# Patient Record
Sex: Male | Born: 1995 | Hispanic: No | Marital: Single | Smoking: Never smoker
Health system: Southern US, Community
[De-identification: ages and names within clinical notes are randomized; demographics above are authoritative.]

## PROBLEM LIST (undated history)

## (undated) DIAGNOSIS — L509 Urticaria, unspecified: Secondary | ICD-10-CM

## (undated) HISTORY — DX: Urticaria, unspecified: L50.9

---
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
## 2016-02-14 ENCOUNTER — Ambulatory Visit (INDEPENDENT_AMBULATORY_CARE_PROVIDER_SITE_OTHER): Payer: BLUE CROSS/BLUE SHIELD | Admitting: Physician Assistant

## 2016-02-14 VITALS — BP 110/70 | HR 85 | Temp 98.2°F | Resp 16 | Ht 68.0 in | Wt 139.0 lb

## 2016-02-14 DIAGNOSIS — R059 Cough, unspecified: Secondary | ICD-10-CM

## 2016-02-14 DIAGNOSIS — Z23 Encounter for immunization: Secondary | ICD-10-CM | POA: Diagnosis not present

## 2016-02-14 DIAGNOSIS — R0989 Other specified symptoms and signs involving the circulatory and respiratory systems: Secondary | ICD-10-CM | POA: Diagnosis not present

## 2016-02-14 DIAGNOSIS — R05 Cough: Secondary | ICD-10-CM

## 2016-02-14 MED ORDER — PREDNISONE 20 MG PO TABS: 40.0000 mg | ORAL_TABLET | Freq: Every day | ORAL | 0 refills | Status: DC

## 2016-02-14 MED ORDER — IPRATROPIUM BROMIDE 0.02 % IN SOLN
0.5000 mg | RESPIRATORY_TRACT | Status: AC
  Administered 2016-02-14: 0.5 mg via RESPIRATORY_TRACT

## 2016-02-14 MED ORDER — ALBUTEROL SULFATE (2.5 MG/3ML) 0.083% IN NEBU
2.5000 mg | INHALATION_SOLUTION | RESPIRATORY_TRACT | Status: AC
  Administered 2016-02-14: 2.5 mg via RESPIRATORY_TRACT

## 2016-02-14 MED ORDER — ALBUTEROL SULFATE HFA 108 (90 BASE) MCG/ACT IN AERS: 2.0000 | INHALATION_SPRAY | Freq: Four times a day (QID) | RESPIRATORY_TRACT | 0 refills | Status: DC | PRN

## 2016-02-14 NOTE — Patient Instructions (Signed)
     IF you received an x-ray today, you will receive an invoice from [facility]. Please contact [facility] at [phone] with questions or concerns regarding your invoice.   IF you received labwork today, you will receive an invoice from [facility]. Please contact [facility] at [phone] with questions or concerns regarding your invoice.   Our billing staff will not be able to assist you with questions regarding bills from these companies.  You will be contacted with the lab results as soon as they are available. The fastest way to get your results is to activate your My Chart account. Instructions are located on the last page of this paperwork. If you have not heard from us regarding the results in 2 weeks, please contact this office.      

## 2016-02-14 NOTE — Progress Notes (Signed)
°  By signing my name below, I, Raven Small, attest that this documentation has been prepared under the direction and in the presence of Deliah BostonMichael Clark, PA-C.  Electronically Signed: Andrew Auaven Small, ED Scribe. 02/14/2016. 2:13 PM.  02/14/2016 2:13 PM   DOB: 21-Feb-1996 / MRN: [id number]  SUBJECTIVE:  Hector Sanders is a 20 y.o. male presenting for a dry cough onset 2 weeks ago. He has a hx of asthma. Pt is not taking any medications.  States symptoms started 2 weeks ago with cold that lasted about 7 days.  States cold improved but cough persisted. Cough worsens throughout the day. He reports some throat irritation described as tickling. He denies CP, chest tightness, leg swelling, heart burn, SOB and fever.  No hx of diabetes.   He has No Known Allergies.   He  has no past medical history on file.    He  reports that he has never smoked. He has never used smokeless tobacco. He reports that he does not drink alcohol or use drugs. He  has no sexual activity history on file. The patient  has no past surgical history on file.  His family history is not on file.  Review of Systems  Constitutional: Negative for chills and fever.  Respiratory: Positive for cough. Negative for sputum production, shortness of breath and wheezing.   Cardiovascular: Negative for chest pain and leg swelling.  Gastrointestinal: Negative for heartburn.    The problem list and medications were reviewed and updated by myself where necessary and exist elsewhere in the encounter.   OBJECTIVE:  BP 110/70 (BP Location: Right Arm, Patient Position: Sitting, Cuff Size: Normal)    Pulse 85    Temp 98.2 F (36.8 C) (Oral)    Resp 16    Ht 5\' 8"  (1.727 m)    Wt 139 lb (63 kg)    SpO2 99%    BMI 21.13 kg/m   Physical Exam  Constitutional: He is oriented to person, place, and time. Vital signs are normal. He appears well-developed.  Cardiovascular: Normal rate.   Pulmonary/Chest: Effort normal. No respiratory distress. He has no  wheezes. He has no rhonchi. He has no rales.  Musculoskeletal: Normal range of motion.  Neurological: He is alert and oriented to person, place, and time.  Skin: Skin is warm and dry.  Psychiatric: He has a normal mood and affect. His behavior is normal. Judgment and thought content normal.   Spirometry: 400 Post duoneb: 500  ASSESSMENT AND PLAN  Cough Asthma like presentation despite negative wheeze. Peak flow improved by 1/4 after duoneb. Sending prednisone and inhaler into phramacy. Advise he monitor night time cough and try albuterol if he coughs. RTC if cough is persistent after getting him through this flare as he may benefit for potential long term treatments for asthma.   Abnormally low peak expiratory flow rate - Plan: albuterol (PROVENTIL) (2.5 MG/3ML) 0.083% nebulizer solution 2.5 mg, ipratropium (ATROVENT) nebulizer solution 0.5 mg  Needs flu shot - Plan: Flu Vaccine QUAD 36+ mos IM     The patient is advised to call or return to clinic if he does not see an improvement in symptoms, or to seek the care of the closest emergency department if he worsens with the above plan.   Deliah BostonMichael Clark, MHS, PA-C [facility] 02/14/2016 2:13 PM

## 2016-03-07 ENCOUNTER — Ambulatory Visit (INDEPENDENT_AMBULATORY_CARE_PROVIDER_SITE_OTHER): Payer: BLUE CROSS/BLUE SHIELD

## 2016-03-07 ENCOUNTER — Ambulatory Visit (INDEPENDENT_AMBULATORY_CARE_PROVIDER_SITE_OTHER): Payer: BLUE CROSS/BLUE SHIELD | Admitting: Physician Assistant

## 2016-03-07 VITALS — BP 104/74 | HR 88 | Temp 97.8°F | Resp 16 | Ht 68.0 in | Wt 138.8 lb

## 2016-03-07 DIAGNOSIS — Z23 Encounter for immunization: Secondary | ICD-10-CM | POA: Diagnosis not present

## 2016-03-07 DIAGNOSIS — R053 Chronic cough: Secondary | ICD-10-CM

## 2016-03-07 DIAGNOSIS — R05 Cough: Secondary | ICD-10-CM

## 2016-03-07 MED ORDER — PREDNISONE 20 MG PO TABS: ORAL_TABLET | ORAL | 0 refills | Status: AC

## 2016-03-07 MED ORDER — RANITIDINE HCL 150 MG PO TABS: 150.0000 mg | ORAL_TABLET | Freq: Two times a day (BID) | ORAL | 1 refills | Status: DC

## 2016-03-07 NOTE — Patient Instructions (Signed)
     IF you received an x-ray today, you will receive an invoice from [facility]. Please contact [facility] at [phone] with questions or concerns regarding your invoice.   IF you received labwork today, you will receive an invoice from [facility]. Please contact [facility] at [phone] with questions or concerns regarding your invoice.   Our billing staff will not be able to assist you with questions regarding bills from these companies.  You will be contacted with the lab results as soon as they are available. The fastest way to get your results is to activate your My Chart account. Instructions are located on the last page of this paperwork. If you have not heard from us regarding the results in 2 weeks, please contact this office.      

## 2016-03-07 NOTE — Progress Notes (Signed)
03/07/2016 2:10 PM   DOB: 1995-10-02 / MRN: [id number]  SUBJECTIVE:  Hector Sanders is a 20 y.o. male presenting for recheck of cough.  Reports the albuterol inhaler and steroid did help his cough, however upon pred cessation his cough slowly returned.  The cough was preceded by a cold that started about 1 month ago.  He has no history of asthma. Denies GERD, voice change, acidic taste.     Immunization History  Administered Date(s) Administered  . Influenza,inj,Quad PF,36+ Mos 02/14/2016  . Tdap 03/07/2016   He has No Known Allergies.   He  has no past medical history on file.    He  reports that he has never smoked. He has never used smokeless tobacco. He reports that he does not drink alcohol or use drugs. He  has no sexual activity history on file. The patient  has no past surgical history on file.  His family history is not on file.  Review of Systems  Constitutional: Negative for chills and fever.  Respiratory: Negative for hemoptysis, sputum production, shortness of breath and wheezing.   Cardiovascular: Negative for chest pain.  Gastrointestinal: Negative for nausea.  Skin: Negative for itching and rash.  Neurological: Negative for dizziness.    The problem list and medications were reviewed and updated by myself where necessary and exist elsewhere in the encounter.   OBJECTIVE:  BP 104/74 (BP Location: Right Arm, Patient Position: Sitting, Cuff Size: Normal)   Pulse 88   Temp 97.8 F (36.6 C) (Oral)   Resp 16   Ht 5\' 8"  (1.727 m)   Wt 138 lb 12.8 oz (63 kg)   SpO2 99%   BMI 21.10 kg/m   Physical Exam  Constitutional: He is oriented to person, place, and time. No distress.  Cardiovascular: Normal rate, regular rhythm, normal heart sounds and intact distal pulses.   Pulmonary/Chest: Effort normal and breath sounds normal.  Musculoskeletal: Normal range of motion. He exhibits no edema.  Neurological: He is alert and oriented to person, place, and time.  Skin: He  is not diaphoretic.    No results found for this or any previous visit (from the past 72 hour(s)).  Dg Chest 2 View  Result Date: 03/07/2016 CLINICAL DATA:  Persistent cough, postviral, for roughly 1 month. EXAM: CHEST  2 VIEW COMPARISON:  None. FINDINGS: The heart size and mediastinal contours are within normal limits. Both lungs are clear. The visualized skeletal structures are unremarkable. IMPRESSION: No active cardiopulmonary disease. Electronically Signed   By: Elsie StainJohn T Curnes M.D.   On: 03/07/2016 14:01    ASSESSMENT AND PLAN  Hector Sanders was seen today for follow-up.  Diagnoses and all orders for this visit:  Persistent cough for 3 weeks or longer Comments: Reactive vs. post viral vs. upper airway.  Will cover for first 2 with more pred.. Zantac nightly for latter. RTC 2 weeks for ICS if needed. Orders: -     Tdap vaccine greater than or equal to 7yo IM -     DG Chest 2 View; Future -     predniSONE (DELTASONE) 20 MG tablet; Take 3 in the morning for 3 days, then 2 in the morning for 3 days, and then 1 in the morning for 3 days. -     ranitidine (ZANTAC) 150 MG tablet; Take 1 tablet (150 mg total) by mouth 2 (two) times daily.    The patient is advised to call or return to clinic if he does not see an  improvement in symptoms, or to seek the care of the closest emergency department if he worsens with the above plan.   Deliah BostonMichael Clark, MHS, PA-C [facility] 03/07/2016 2:10 PM

## 2018-05-22 IMAGING — DX DG CHEST 2V
2 series · 2 of 2 positions shown · non-contrast
Comparison: None.

CLINICAL DATA: Persistent cough, postviral, for roughly 1 month.

EXAM:
CHEST  2 VIEW

[chest pa]
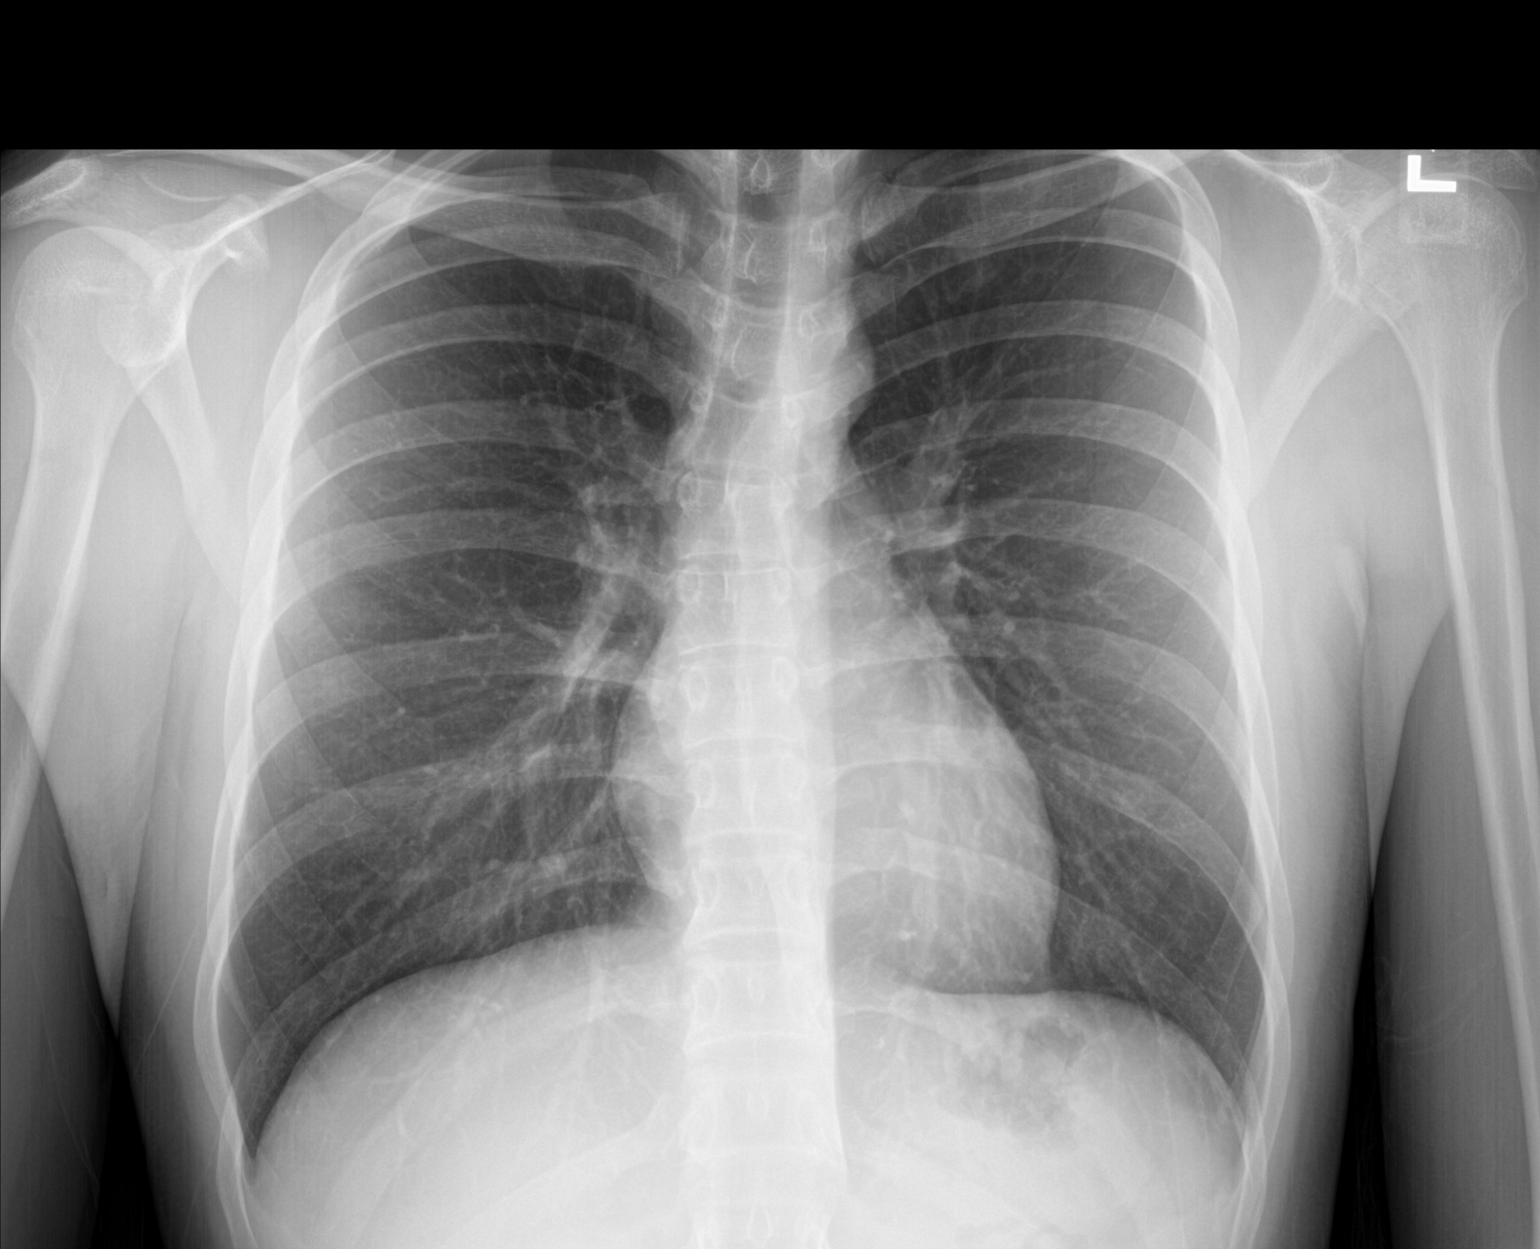

[chest lat]
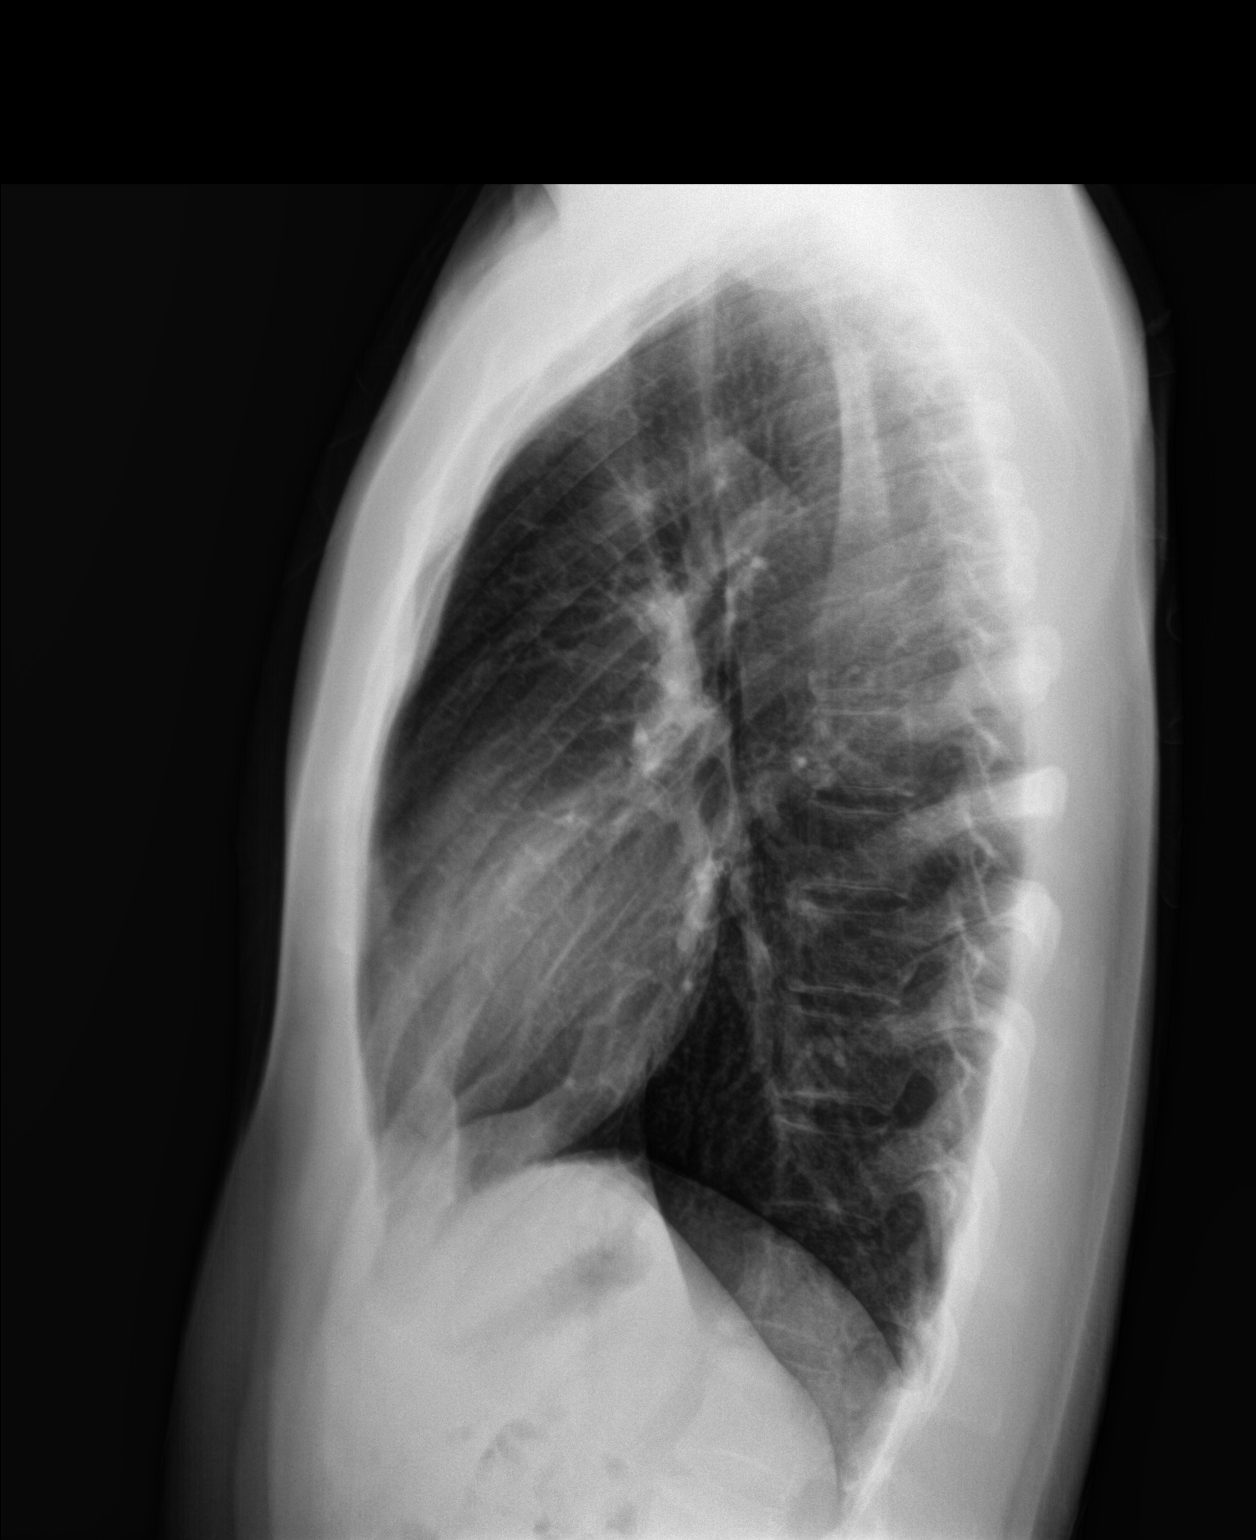

[2 of 2 positions shown; findings below may reference images not displayed]

FINDINGS: The heart size and mediastinal contours are within normal limits.
Both lungs are clear. The visualized skeletal structures are
unremarkable.
IMPRESSION: No active cardiopulmonary disease.

## 2020-11-26 ENCOUNTER — Other Ambulatory Visit: Payer: Self-pay

## 2020-11-26 ENCOUNTER — Ambulatory Visit (INDEPENDENT_AMBULATORY_CARE_PROVIDER_SITE_OTHER): Payer: 59 | Admitting: Allergy

## 2020-11-26 ENCOUNTER — Encounter: Payer: Self-pay | Admitting: Allergy

## 2020-11-26 VITALS — BP 110/70 | HR 116 | Temp 98.8°F | Resp 16 | Ht 67.0 in | Wt 174.4 lb

## 2020-11-26 DIAGNOSIS — L508 Other urticaria: Secondary | ICD-10-CM | POA: Diagnosis not present

## 2020-11-26 NOTE — Progress Notes (Signed)
New Patient Note  RE: Hector Sanders MRN: [id number] DOB: 14-Feb-1996 Date of Office Visit: 11/26/2020  Referring provider: No ref. provider found Primary care provider: Pcp, No  Chief Complaint: hives  History of present illness: Hector Sanders is a 25 y.o. male presenting today for evaluation of urticaria.    Hive started in late May 2022 and occurring daily.  Hives can occur anywhere on body and is very itchy.  He states hives are present for about a week before resolving.  Hives are leaving bruising behind.  No fevers.  He does report some pain of his left knee.  He does report swelling of his fingers.  No preceding illness.  No new medications.  No new foods or food flares.  He states he has tried some food elimination which did not help.  No stings/bites.  No change in soaps/detergerent/body products.  He has gone twice to UC for this issue and was treated with prednisone both times.  The prednisone "really helped".  He has been taking zyrtec and zantac (famotidine) both once a day and feels it is helping.    He reports history of asthma during middle school that resolved. No history of eczema. He does report coughing during spring season.  No history of food allergy.    Review of systems: Review of Systems  Constitutional: Negative.   HENT: Negative.    Eyes: Negative.   Respiratory: Negative.    Cardiovascular: Negative.   Gastrointestinal: Negative.   Musculoskeletal:  Positive for joint pain.  Skin:  Positive for itching and rash.  Neurological: Negative.    All other systems negative unless noted above in HPI  Past medical history: Past Medical History:  Diagnosis Date   Urticaria     Past surgical history: History reviewed. No pertinent surgical history.  Family history:  Family History  Problem Relation Age of Onset   Urticaria Sister    Allergic rhinitis Sister     Social history: Lives in a home with carpeting with gas heating and central cooling.  No  concern for water damage, mildew or roaches in the home.  No pets in the home.  He does not report an occupation at this time.  Does not report a smoking history.   Medication List: Current Outpatient Medications  Medication Sig Dispense Refill   cetirizine (ZYRTEC) 10 MG tablet Take 10 mg by mouth daily.     ranitidine (ZANTAC) 150 MG tablet Take 1 tablet (150 mg total) by mouth 2 (two) times daily. 30 tablet 1   No current facility-administered medications for this visit.    Known medication allergies: No Known Allergies   Physical examination: Blood pressure 110/70, pulse (!) 116, temperature 98.8 F (37.1 C), temperature source Temporal, resp. rate 16, height 5\' 7"  (1.702 m), weight 174 lb 6.4 oz (79.1 kg), SpO2 95 %.  General: Alert, interactive, in no acute distress. HEENT: PERRLA, TMs pearly gray, turbinates non-edematous without discharge, post-pharynx non erythematous. Neck: Supple without lymphadenopathy. Lungs: Clear to auscultation without wheezing, rhonchi or rales. {no increased work of breathing. CV: Normal S1, S2 without murmurs. Abdomen: Nondistended, nontender. Skin: Scattered erythematous urticarial type lesions primarily located lower extremities bilaterally extending to the medial thigh, left to medial upper arm, left side neck, nonvesicular.  There are some hyperpigmented patches on the left medial forearm Extremities:  No clubbing, cyanosis or edema. Neuro:   Grossly intact.  Diagnositics/Labs: None due to ongoing urticaria  Assessment and plan: Chronic urticaria -  at this time etiology of hives is unknown.  Hives can be caused by a variety of different triggers including illness/infection, foods, medications, stings, exercise, pressure, vibrations, extremes of temperature to name a few however majority of the time there is no identifiable trigger.  There are some concerning features to his urticaria including bruising and joint ache.  He may warrant biopsy  to rule out urticarial vasculitis.  Your symptoms have been ongoing for >6 weeks making this chronic thus will obtain labwork to evaluate: CBC w diff, CMP, tryptase, hive panel, environmental panel, alpha-gal panel, inflammatory markers - for hive control recommend taking your Zyrtec and "Zantac " 1 tab twice a day -if twice a day dosing is not effective enough then would recommend adding Singulair -if above antihistamine regimen is not effective enough in controlling hives and swelling then recommend adding Singulair 10mg  daily.    If triple therapy regimen is not effective enough then consider Xolair monthly injections which is indicated for chronic spontaneous hives/swelling  Follow-up in 2-3 months or sooner if needed  I appreciate the opportunity to take part in Josiel's care. Please do not hesitate to contact me with questions.  Sincerely,   , MD Allergy/Immunology [facility]

## 2020-11-26 NOTE — Patient Instructions (Addendum)
Hives - at this time etiology of hives and swelling is unknown.  Hives can be caused by a variety of different triggers including illness/infection, foods, medications, stings, exercise, pressure, vibrations, extremes of temperature to name a few however majority of the time there is no identifiable trigger.  Your symptoms have been ongoing for >6 weeks making this chronic thus will obtain labwork to evaluate: CBC w diff, CMP, tryptase, hive panel, environmental panel, alpha-gal panel, inflammatory markers - for hive control recommend taking your Zyrtec and "Zantac " 1 tab twice a day -if twice a day dosing is not effective enough then would recommend adding Singulair -if above antihistamine regimen is not effective enough in controlling hives and swelling then recommend adding Singulair 10mg  daily.    If triple therapy regimen is not effective enough then consider Xolair monthly injections which is indicated for chronic spontaneous hives/swelling  Follow-up in 2-3 months or sooner if needed

## 2020-12-07 ENCOUNTER — Telehealth: Payer: Self-pay | Admitting: Allergy

## 2020-12-07 NOTE — Telephone Encounter (Signed)
Please advise to regiment for pts hive break out as the zyrtec and zantac is not helping

## 2020-12-07 NOTE — Telephone Encounter (Signed)
Patient called because he does not feel his current allergy regiment is working for him. Patient states he is currently using zyrtec and zantac and at first it was working for him, however the past few days he has noticed more red areas and hives. Patient was told to call if regiment was not working so that singulair could be called in for him.   [facility] - [address] [place], [place], [place] [address] [phone]  Patient is also requesting Dr. Delorse Lek take over his zyrtec and zantac prescriptions.   Patient would like a call when medication gets sent in, [phone]

## 2020-12-08 ENCOUNTER — Telehealth: Payer: Self-pay

## 2020-12-08 LAB — ALPHA-GAL PANEL
Allergen Lamb IgE: <0.1 kU/L
Beef IgE: <0.1 kU/L
IgE (Immunoglobulin E), Serum: 123 IU/mL (ref 6–495)
O215-IgE Alpha-Gal: <0.1 kU/L
Pork IgE: <0.1 kU/L

## 2020-12-08 LAB — ALLERGENS W/TOTAL IGE AREA 2
Alternaria Alternata IgE: <0.1 kU/L
Aspergillus Fumigatus IgE: <0.1 kU/L
Bermuda Grass IgE: <0.1 kU/L
Cat Dander IgE: <0.1 kU/L
Cedar, Mountain IgE: <0.1 kU/L
Cladosporium Herbarum IgE: <0.1 kU/L
Cockroach, German IgE: 0.3 kU/L — AB
Common Silver Birch IgE: <0.1 kU/L
Cottonwood IgE: <0.1 kU/L
D Farinae IgE: 1.57 kU/L — AB
D Pteronyssinus IgE: 1.67 kU/L — AB
Dog Dander IgE: <0.1 kU/L
Elm, American IgE: <0.1 kU/L
Johnson Grass IgE: <0.1 kU/L
Maple/Box Elder IgE: <0.1 kU/L
Mouse Urine IgE: <0.1 kU/L
Oak, White IgE: <0.1 kU/L
Pecan, Hickory IgE: <0.1 kU/L
Penicillium Chrysogen IgE: <0.1 kU/L
Pigweed, Rough IgE: <0.1 kU/L
Ragweed, Short IgE: 0.11 kU/L — AB
Sheep Sorrel IgE Qn: <0.1 kU/L
Timothy Grass IgE: <0.1 kU/L
White Mulberry IgE: <0.1 kU/L

## 2020-12-08 LAB — CBC WITH DIFFERENTIAL
Basophils Absolute: 0 10*3/uL (ref 0.0–0.2)
Basos: 0 %
EOS (ABSOLUTE): 0.1 10*3/uL (ref 0.0–0.4)
Eos: 1 %
Hematocrit: 52.6 % — ABNORMAL HIGH (ref 37.5–51.0)
Hemoglobin: 17 g/dL (ref 13.0–17.7)
Immature Grans (Abs): 0.1 10*3/uL (ref 0.0–0.1)
Immature Granulocytes: 1 %
Lymphocytes Absolute: 2 10*3/uL (ref 0.7–3.1)
Lymphs: 16 %
MCH: 29 pg (ref 26.6–33.0)
MCHC: 32.3 g/dL (ref 31.5–35.7)
MCV: 90 fL (ref 79–97)
Monocytes Absolute: 0.8 10*3/uL (ref 0.1–0.9)
Monocytes: 7 %
Neutrophils Absolute: 9.1 10*3/uL — ABNORMAL HIGH (ref 1.4–7.0)
Neutrophils: 75 %
RBC: 5.87 x10E6/uL — ABNORMAL HIGH (ref 4.14–5.80)
RDW: 12.6 % (ref 11.6–15.4)
WBC: 12.1 10*3/uL — ABNORMAL HIGH (ref 3.4–10.8)

## 2020-12-08 LAB — ANA W/REFLEX: Anti Nuclear Antibody (ANA): NEGATIVE

## 2020-12-08 LAB — SEDIMENTATION RATE: Sed Rate: 12 mm/hr (ref 0–15)

## 2020-12-08 LAB — COMPREHENSIVE METABOLIC PANEL
ALT: 28 IU/L (ref 0–44)
AST: 23 IU/L (ref 0–40)
Albumin/Globulin Ratio: 1.8 (ref 1.2–2.2)
Albumin: 5.3 g/dL — ABNORMAL HIGH (ref 4.1–5.2)
Alkaline Phosphatase: 114 IU/L (ref 44–121)
BUN/Creatinine Ratio: 15 (ref 9–20)
BUN: 13 mg/dL (ref 6–20)
Bilirubin Total: 0.6 mg/dL (ref 0.0–1.2)
CO2: 25 mmol/L (ref 20–29)
Calcium: 10.3 mg/dL — ABNORMAL HIGH (ref 8.7–10.2)
Chloride: 99 mmol/L (ref 96–106)
Creatinine, Ser: 0.89 mg/dL (ref 0.76–1.27)
Globulin, Total: 2.9 g/dL (ref 1.5–4.5)
Glucose: 94 mg/dL (ref 65–99)
Potassium: 4.1 mmol/L (ref 3.5–5.2)
Sodium: 141 mmol/L (ref 134–144)
Total Protein: 8.2 g/dL (ref 6.0–8.5)
eGFR: 123 mL/min/{1.73_m2} (ref >59)

## 2020-12-08 LAB — CHRONIC URTICARIA: cu index: >50 — ABNORMAL HIGH (ref <10)

## 2020-12-08 LAB — TRYPTASE: Tryptase: 5.5 ug/L (ref 2.2–13.2)

## 2020-12-08 MED ORDER — CETIRIZINE HCL 10 MG PO TABS: 10.0000 mg | ORAL_TABLET | Freq: Two times a day (BID) | ORAL | 5 refills | Status: AC

## 2020-12-08 MED ORDER — MONTELUKAST SODIUM 10 MG PO TABS: 10.0000 mg | ORAL_TABLET | Freq: Every day | ORAL | 5 refills | Status: AC

## 2020-12-08 MED ORDER — RANITIDINE HCL 75 MG PO TABS: 150.0000 mg | ORAL_TABLET | Freq: Two times a day (BID) | ORAL | 5 refills | Status: AC

## 2020-12-08 NOTE — Telephone Encounter (Signed)
Pa submitted thru cover my meds for cetirizine 10mg  waiting on results

## 2020-12-17 NOTE — Telephone Encounter (Signed)
Patient called stating he has taken zantac, zyrtec and singulair, however this regiment is not working for him Patient still has a lot of hives, patient states they are not as itchy which is probably because of the medication. Patient states he may need to try the third regiment which would be Xolair injections. Patient would like someone to reach out and discuss this with him.  Best contact number: [phone]

## 2020-12-17 NOTE — Telephone Encounter (Signed)
Please advice in regards to patient's request. Do we go ahead and get Tammy to get insurance approval for Xolair?

## 2020-12-20 NOTE — Telephone Encounter (Signed)
L/M for patient to contact me to advise approval and submit for Xolair 

## 2020-12-21 NOTE — Telephone Encounter (Signed)
Patient returned call and advised approval, copay card and submit to [facility] pharmacy and will reach out to make appt to start once delivery set up

## 2021-01-25 ENCOUNTER — Telehealth: Payer: Self-pay

## 2021-01-25 ENCOUNTER — Other Ambulatory Visit: Payer: Self-pay

## 2021-01-25 ENCOUNTER — Ambulatory Visit (INDEPENDENT_AMBULATORY_CARE_PROVIDER_SITE_OTHER): Payer: 59 | Admitting: *Deleted

## 2021-01-25 DIAGNOSIS — L501 Idiopathic urticaria: Secondary | ICD-10-CM | POA: Diagnosis not present

## 2021-01-25 MED ORDER — EPINEPHRINE 0.3 MG/0.3ML IJ SOAJ: 0.3000 mg | Freq: Once | INTRAMUSCULAR | 1 refills | Status: AC

## 2021-01-25 MED ORDER — OMALIZUMAB 150 MG/ML ~~LOC~~ SOSY
300.0000 mg | PREFILLED_SYRINGE | SUBCUTANEOUS | Status: AC
  Administered 2021-01-25 – 2021-07-28 (×5): 300 mg via SUBCUTANEOUS

## 2021-01-25 NOTE — Telephone Encounter (Signed)
Patient came into the office for a paper copy of his lab results. I printed a flowsheet with all his lab work from 11/26/20. I also printed out a word document of the result note of Dr. Delorse Lek explaining what each panel meant for the patient to review. He did receive the Emergency Action Plan and did not need a copy of that printed out today 01/25/21.

## 2021-01-25 NOTE — Progress Notes (Signed)
Immunotherapy   Patient Details  Name: Hector Sanders MRN: [id number] Date of Birth: 1995/11/24  01/25/2021  [name] started injections for  Xolair  Frequency: Every 28 Days  Epi-Pen:Epi-Pen Available  Consent signed and patient instructions given. Patient started Xolair and received 59mL in the RUA and 9mL in the LUA. Patient waited 30 minutes and did not experience any issues.    [name] 01/25/2021, 5:39 PM

## 2021-02-22 ENCOUNTER — Ambulatory Visit (INDEPENDENT_AMBULATORY_CARE_PROVIDER_SITE_OTHER): Payer: 59 | Admitting: *Deleted

## 2021-02-22 ENCOUNTER — Other Ambulatory Visit: Payer: Self-pay

## 2021-02-22 DIAGNOSIS — L501 Idiopathic urticaria: Secondary | ICD-10-CM | POA: Diagnosis not present

## 2021-03-03 ENCOUNTER — Ambulatory Visit: Payer: 59 | Admitting: Allergy

## 2021-03-15 ENCOUNTER — Encounter: Payer: Self-pay | Admitting: Family Medicine

## 2021-03-15 ENCOUNTER — Other Ambulatory Visit: Payer: Self-pay

## 2021-03-15 ENCOUNTER — Ambulatory Visit (INDEPENDENT_AMBULATORY_CARE_PROVIDER_SITE_OTHER): Payer: 59 | Admitting: Family Medicine

## 2021-03-15 VITALS — BP 118/76 | HR 100 | Temp 98.1°F | Resp 12 | Ht 69.0 in | Wt 178.2 lb

## 2021-03-15 DIAGNOSIS — J302 Other seasonal allergic rhinitis: Secondary | ICD-10-CM | POA: Diagnosis not present

## 2021-03-15 DIAGNOSIS — L508 Other urticaria: Secondary | ICD-10-CM

## 2021-03-15 DIAGNOSIS — J3089 Other allergic rhinitis: Secondary | ICD-10-CM

## 2021-03-15 MED ORDER — EPINEPHRINE 0.3 MG/0.3ML IJ SOAJ: 0.3000 mg | Freq: Once | INTRAMUSCULAR | 2 refills | Status: AC

## 2021-03-15 NOTE — Patient Instructions (Addendum)
Allergic rhinitis Continue allergen avoidance measures directed toward dust mite, cockroach, and ragweed as listed below Consider saline nasal rinses as needed for nasal symptoms. Use this before any medicated nasal sprays for best result  Hives (urticaria) Take the least amount of medications while remaining hive free Cetirizine (Zyrtec) 10mg  twice a day and ranitidine (Zantac) 75 mg twice a day. If no symptoms for 7-14 days then decrease to. Cetirizine (Zyrtec) 10mg  twice a day and  ranitidine (Zantac) 75mg  once a day.  If no symptoms for 7-14 days then decrease to. Cetirizine (Zyrtec) 10mg  twice a day.  If no symptoms for 7-14 days then decrease to. Cetirizine (Zyrtec) 10mg  once a day.  May use Benadryl (diphenhydramine) as needed for breakthrough hives       If symptoms return, then step up dosage Keep a detailed symptom journal including foods eaten, contact with allergens, medications taken, weather changes.  Continue Xolair 300 mg injections once every 28 days and have access to an epinephrine autoinjector set  Call the clinic if this treatment plan is not working well for you.  Follow up in 6-9 months or sooner if needed.  Control of Dust Mite Allergen Dust mites play a major role in allergic asthma and rhinitis. They occur in environments with high humidity wherever human skin is found. Dust mites absorb humidity from the atmosphere (ie, they do not drink) and feed on organic matter (including shed human and animal skin). Dust mites are a microscopic type of insect that you cannot see with the naked eye. High levels of dust mites have been detected from mattresses, pillows, carpets, upholstered furniture, bed covers, clothes, soft toys and any woven material. The principal allergen of the dust mite is found in its feces. A gram of dust may contain 1,000 mites and 250,000 fecal particles. Mite antigen is easily measured in the air during house cleaning activities. Dust mites do not bite  and do not cause harm to humans, other than by triggering allergies/asthma.  Ways to decrease your exposure to dust mites in your home:  1. Encase mattresses, box springs and pillows with a mite-impermeable barrier or cover  2. Wash sheets, blankets and drapes weekly in hot water (130 F) with detergent and dry them in a dryer on the hot setting.  3. Have the room cleaned frequently with a vacuum cleaner and a damp dust-mop. For carpeting or rugs, vacuuming with a vacuum cleaner equipped with a high-efficiency particulate air (HEPA) filter. The dust mite allergic individual should not be in a room which is being cleaned and should wait 1 hour after cleaning before going into the room.  4. Do not sleep on upholstered furniture (eg, couches).  5. If possible removing carpeting, upholstered furniture and drapery from the home is ideal. Horizontal blinds should be eliminated in the rooms where the person spends the most time (bedroom, study, television room). Washable vinyl, roller-type shades are optimal.  6. Remove all non-washable stuffed toys from the bedroom. Wash stuffed toys weekly like sheets and blankets above.  7. Reduce indoor humidity to less than 50%. Inexpensive humidity monitors can be purchased at most hardware stores. Do not use a humidifier as can make the problem worse and are not recommended.  Control of Cockroach Allergen Cockroach allergen has been identified as an important cause of acute attacks of asthma, especially in urban settings.  There are fifty-five species of cockroach that exist in the , however only three, the , and species  produce allergen that can affect patients with Asthma.  Allergens can be obtained from fecal particles, egg casings and secretions from cockroaches.    Remove food sources. Reduce access to water. Seal access and entry points. Spray runways with 0.5-1% Diazinon or Chlorpyrifos Blow boric acid power under  stoves and refrigerator. Place bait stations (hydramethylnon) at feeding sites.   Reducing Pollen Exposure The American Academy of Allergy, Asthma [employer] suggests the following steps to reduce your exposure to pollen during allergy seasons. Do not hang sheets or clothing out to dry; pollen may collect on these items. Do not mow lawns or spend time around freshly cut grass; mowing stirs up pollen. Keep windows closed at night.  Keep car windows closed while driving. Minimize morning activities outdoors, a time when pollen counts are usually at their highest. Stay indoors as much as possible when pollen counts or humidity is high and on windy days when pollen tends to remain in the air longer. Use air conditioning when possible.  Many air conditioners have filters that trap the pollen spores. Use a HEPA room air filter to remove pollen form the indoor air you breathe.

## 2021-03-15 NOTE — Progress Notes (Addendum)
[address] [place] [address] Dept: [phone]  FOLLOW UP NOTE  Patient ID: Hector Sanders, male    DOB: 02-02-1996  Age: 25 y.o. MRN: [id number] Date of Office Visit: 03/15/2021  Assessment  Chief Complaint: Follow-up (Patient states everything has been going well. Since he started doing the Xolair shots he thinks that has been helping with allergies.)  HPI Hector Sanders is a 25 year old male who presents the clinic for follow-up visit.  He was last seen in this clinic on 11/26/2020 by Dr. Delorse Lek for evaluation of chronic urticaria and allergic rhinitis.  At today's visit, he reports his chronic urticaria has been well controlled with no breakouts since his last visit to this clinic.  He reports that he has recently removed the carpet in his home and replaced the flooring with wood flooring.  He continues Xolair 300 mg once every 4 weeks with no large or local reactions.  He reports taking montelukast 10 mg once every other day and stopped taking cetirizine and Zantac last week.  He has not had recurrence of hives during the week.  Allergic rhinitis has been well controlled with no rhinorrhea, congestion, sneezing, or postnasal drainage.  He reports that he does have dust mite covers on his mattress and pillows.  He last had lab work for evaluation of environmental allergies on 11/26/2020 which was positive for dust mite, cockroach, and ragweed.   Drug Allergies:  No Known Allergies  Physical Exam: BP 118/76 (Patient Position: Sitting)   Pulse 100   Temp 98.1 F (36.7 C) (Temporal)   Resp 12   Ht 5\' 9"  (1.753 m)   Wt 178 lb 3.2 oz (80.8 kg)   SpO2 98%   BMI 26.32 kg/m    Physical Exam Vitals reviewed.  Constitutional:      Appearance: Normal appearance.  HENT:     Head: Normocephalic and atraumatic.     Right Ear: Tympanic membrane normal.     Left Ear: Tympanic membrane normal.     Nose:     Comments: Bilateral nares slightly erythematous with clear nasal drainage  noted.  Pharynx normal.  Ears normal.  Eyes normal.    Mouth/Throat:     Pharynx: Oropharynx is clear.  Eyes:     Conjunctiva/sclera: Conjunctivae normal.  Cardiovascular:     Rate and Rhythm: Normal rate and regular rhythm.     Heart sounds: Normal heart sounds. No murmur heard. Pulmonary:     Effort: Pulmonary effort is normal.     Breath sounds: Normal breath sounds.     Comments: Lungs clear to auscultation Musculoskeletal:        General: Normal range of motion.     Cervical back: Normal range of motion and neck supple.  Skin:    General: Skin is warm and dry.  Neurological:     Mental Status: He is alert and oriented to person, place, and time.  Psychiatric:        Mood and Affect: Mood normal.        Behavior: Behavior normal.        Thought Content: Thought content normal.        Judgment: Judgment normal.    Assessment and Plan: 1. Chronic urticaria   2. Seasonal and perennial allergic rhinitis     Meds ordered this encounter  Medications   EPINEPHrine (EPIPEN 2-PAK) 0.3 mg/0.3 mL IJ SOAJ injection    Sig: Inject 0.3 mg into the muscle once for 1 dose.  Dispense:  1 each    Refill:  2     Patient Instructions  Allergic rhinitis Continue allergen avoidance measures directed toward dust mite, cockroach, and ragweed as listed below Consider saline nasal rinses as needed for nasal symptoms. Use this before any medicated nasal sprays for best result  Hives (urticaria) Take the least amount of medications while remaining hive free Cetirizine (Zyrtec) 10mg  twice a day and ranitidine (Zantac) 75 mg twice a day. If no symptoms for 7-14 days then decrease to. Cetirizine (Zyrtec) 10mg  twice a day and  ranitidine (Zantac) 75mg  once a day.  If no symptoms for 7-14 days then decrease to. Cetirizine (Zyrtec) 10mg  twice a day.  If no symptoms for 7-14 days then decrease to. Cetirizine (Zyrtec) 10mg  once a day.  May use Benadryl (diphenhydramine) as needed for  breakthrough hives       If symptoms return, then step up dosage Keep a detailed symptom journal including foods eaten, contact with allergens, medications taken, weather changes.  Continue Xolair 300 mg injections once every 28 days and have access to an epinephrine autoinjector set  Call the clinic if this treatment plan is not working well for you.  Follow up in 6-9 months or sooner if needed.   Return in about 6 months (around 09/12/2021), or if symptoms worsen or fail to improve.    Thank you for the opportunity to care for this patient.  Please do not hesitate to contact me with questions.  , FNP [facility]  I have provided oversight concerning ' evaluation and treatment of this patient's health issues addressed during today's encounter. I agree with the assessment and therapeutic plan as outlined in the note.   Signed,   , MD,  Allergy and Immunology,  [facility].

## 2021-03-22 ENCOUNTER — Ambulatory Visit (INDEPENDENT_AMBULATORY_CARE_PROVIDER_SITE_OTHER): Payer: 59 | Admitting: *Deleted

## 2021-03-22 ENCOUNTER — Other Ambulatory Visit: Payer: Self-pay

## 2021-03-22 DIAGNOSIS — L501 Idiopathic urticaria: Secondary | ICD-10-CM | POA: Diagnosis not present

## 2021-04-19 ENCOUNTER — Other Ambulatory Visit: Payer: Self-pay

## 2021-04-19 ENCOUNTER — Ambulatory Visit (INDEPENDENT_AMBULATORY_CARE_PROVIDER_SITE_OTHER): Payer: 59 | Admitting: *Deleted

## 2021-04-19 DIAGNOSIS — L501 Idiopathic urticaria: Secondary | ICD-10-CM

## 2021-05-17 ENCOUNTER — Ambulatory Visit: Payer: 59

## 2021-05-25 ENCOUNTER — Ambulatory Visit: Payer: 59

## 2021-07-14 ENCOUNTER — Telehealth: Payer: Self-pay | Admitting: *Deleted

## 2021-07-14 NOTE — Telephone Encounter (Signed)
Called patient and advised approval with new Ins and submit to [facility] with copay card info. Will reach once delivery set to adivse patient to make appt to restart therapy ?

## 2021-07-14 NOTE — Telephone Encounter (Signed)
-----   Message from Lavera Guise sent at 07/07/2021  4:31 PM EDT ----- ?Regarding: XOLAIR ?THIS PATIENT CAME IN TODAY WITH AN INSURANCE CHANGE AND HE IS ON XOLAIR.  HE IS WANTING TO GET BACK ON IT AND SAID TO PLEASE DO THIS "AS QUICKLY AS YOU CAN"  SO I TOLD HIM SINCE HE IS JUST NOW BRINGING IN THIS CARD YOU HAVE TO START THE PROCESS ALL OVER AGAIN AND THAT IS GOING TO TAKE SOME TIME.  SO I TOLD HIM TO BE PATIENT AND THAT YOU WOULD REACH OUT TO HIM.Marland KitchenMarland Kitchen I ALREADY SCANNED THE INSURANCE IN AND I PUT IT IN Epic TOO ? ? ?

## 2021-07-28 ENCOUNTER — Ambulatory Visit (INDEPENDENT_AMBULATORY_CARE_PROVIDER_SITE_OTHER): Payer: BC Managed Care – PPO

## 2021-07-28 DIAGNOSIS — L501 Idiopathic urticaria: Secondary | ICD-10-CM | POA: Diagnosis not present

## 2021-07-28 MED ORDER — EPINEPHRINE 0.3 MG/0.3ML IJ SOAJ: 0.3000 mg | Freq: Once | INTRAMUSCULAR | 1 refills | Status: AC

## 2021-07-28 NOTE — Progress Notes (Signed)
Patient stated that he misplaced his Epipen and needed a refill. I have sent in a new pack of Epipen for him to the pharmacy of his choice.  ?

## 2021-08-25 ENCOUNTER — Ambulatory Visit: Payer: BC Managed Care – PPO
